# Patient Record
Sex: Female | Born: 1955
Health system: Southern US, Community
[De-identification: ages and names within clinical notes are randomized; demographics above are authoritative.]

## PROBLEM LIST (undated history)

## (undated) DIAGNOSIS — C801 Malignant (primary) neoplasm, unspecified: Secondary | ICD-10-CM

## (undated) DIAGNOSIS — G709 Myoneural disorder, unspecified: Secondary | ICD-10-CM

## (undated) DIAGNOSIS — E119 Type 2 diabetes mellitus without complications: Secondary | ICD-10-CM

## (undated) DIAGNOSIS — K219 Gastro-esophageal reflux disease without esophagitis: Secondary | ICD-10-CM

## (undated) HISTORY — PX: EYE SURGERY: SHX253

---

## 2016-09-27 ENCOUNTER — Other Ambulatory Visit: Payer: Self-pay | Admitting: Surgery

## 2016-09-27 DIAGNOSIS — R9389 Abnormal findings on diagnostic imaging of other specified body structures: Secondary | ICD-10-CM

## 2016-10-01 ENCOUNTER — Ambulatory Visit
Admission: RE | Admit: 2016-10-01 | Discharge: 2016-10-01 | Disposition: A | Discharge status: Home / Self Care | Payer: BLUE CROSS/BLUE SHIELD | Source: Ambulatory Visit | Attending: Surgery | Admitting: Surgery

## 2016-10-01 DIAGNOSIS — R9389 Abnormal findings on diagnostic imaging of other specified body structures: Secondary | ICD-10-CM

## 2016-10-01 MED ORDER — GADOBENATE DIMEGLUMINE 529 MG/ML IV SOLN
17.0000 mL | Freq: Once | INTRAVENOUS | Status: AC | PRN
  Administered 2016-10-01: 17 mL via INTRAVENOUS

## 2016-10-08 DIAGNOSIS — Z170 Estrogen receptor positive status [ER+]: Secondary | ICD-10-CM | POA: Diagnosis not present

## 2016-10-08 DIAGNOSIS — N62 Hypertrophy of breast: Secondary | ICD-10-CM | POA: Diagnosis not present

## 2016-10-08 DIAGNOSIS — D0512 Intraductal carcinoma in situ of left breast: Secondary | ICD-10-CM | POA: Diagnosis not present

## 2016-11-01 ENCOUNTER — Ambulatory Visit: Payer: Self-pay | Admitting: Surgery

## 2016-11-01 DIAGNOSIS — D0512 Intraductal carcinoma in situ of left breast: Secondary | ICD-10-CM

## 2016-11-01 NOTE — H&P (Signed)
History of Present Illness April Hogan. Kassie Keng MD; 11/01/2016 12:19 PM) Patient words: discuss surgery.  The patient is a 60 year old female who presents with breast cancer. The patient is a 60 year old female who presents with breast cancer. PCP - Kennith Maes, MD Surgeon - Melynda Ripple, DO Oncology - Lavera Guise, MD Neurology - [name] Orthopedics - [name]  This is a 60 year old female with a history of multiple sclerosis and diabetes type 2 who presents after recent abnormal mammogram. Her mammograms have been normal going back to 2011. However on 08/13/16 a screening mammogram showed possible asymmetry and calcifications in the left breast. The right breast was unremarkable. On 08/27/16, she underwent diagnostic mammogram and ultrasound. This showed an 8 x 5 x 5 mm group of indeterminate microcalcifications in the lateral lower left breast. She still has some asymmetry in the upper posterior left breast. On 09/13/16 she underwent stereotactic needle biopsy of both of these areas. The area of calcifications was marked with a ribbon-shaped clip. The area of asymmetry in the upper breast was marked with a coil-shaped clip. Unfortunately, on the postbiopsy mammogram the coil-shaped clip had migrated approximately 1 cm above the focal asymmetry. The ribbon-shaped marker had migrated 4.5 cm superior to the calcifications.  The left biopsy site associated with the calcifications returned a diagnosis of intermediate grade DCIS with an incidental foci of atypical lobular hyperplasia. ER 100% PR 90%. The area of asymmetry that was biopsied shows focal atypical lobular hyperplasia. The patient subsequently underwent bilateral breast MRI on 09/22/69. This had a new finding of an 8 x 6 x 5 mm enhancing mass in the lower outer quadrant of the right breast that was not visualized on mammogram. The 2 biopsied areas in the left breast were visualized with no additional findings. All of  these procedures were performed at [facility]. The patient was referred to the surgeon at [facility] and apparently plans were made to perform a needle localized lumpectomy of the DCIS.  On 10/01/16, she underwent MR guided biopsy of the nodule in the right breast. A marker clip was also placed. Pathology showed atypical lobular hyperplasia. The patient has apparently also met with oncology at [facility]. Dr. Bobby Rumpf' recommendation was for lumpectomy x all three lesions, followed by radiation to the DCIS lumpectomy site, followed by Tamoxifen.   According to Dr. Carlos Levering notes, he is recommending needle localized lumpectomy 3 to include the area of DCIS, as well as both areas of atypical lobular hyperplasia.  The patient has also been suffering from a lot of chronic left knee pain. She was scheduled for left total knee replacement by Dr. Adin Hector prior to her diagnosis of breast cancer. Apparently he has told the patient that he will not perform the knee replacement for at least 12 weeks after her course of radiation. It is unclear why he needs to wait this long after radiation. The patient does not feel that she can wait this long. She comes back today to discuss her decision for her treatment plan.   Menarche - 37 First pregnancy - 89 Breastfeed - yes Hormones - about 10 years Menopause - age 81 Family history - none   Problem List/Past Medical April Petties, MD; 11/01/2016 12:19 PM) ATYPICAL LOBULAR HYPERPLASIA (ALH) OF BOTH BREASTS (N60.91, N60.92) DUCTAL CARCINOMA IN SITU (DCIS) OF LEFT BREAST (D05.12) MULTIPLE SCLEROSIS (G35)  Other Problems April Petties, MD; 11/01/2016 12:19 PM) Heart murmur Hemorrhoids Hypercholesterolemia Breast Cancer Diabetes Mellitus  Past Surgical History April Petties, MD; 11/01/2016 12:19 PM) Cataract Surgery Bilateral. Breast Biopsy Bilateral.  Diagnostic Studies History April Petties, MD; 11/01/2016 12:19  PM) Colonoscopy never Mammogram within last year Pap Smear 1-5 years ago  Allergies (April Eversole, LPN; 16/09/9603 5:40 AM) No Known Drug Allergies 10/13/2016  Medication History (April Eversole, LPN; 98/10/9146 8:29 AM) Gilenya (0.5MG Capsule, Oral) Active. Omeprazole (20MG Capsule DR, Oral) Active. MetFORMIN HCl ER (500MG Tablet ER 24HR, Oral) Active. Celecoxib (200MG Capsule, Oral) Active. Pravastatin Sodium (40MG Tablet, Oral) Active. Lumigan (0.01% Solution, Ophthalmic) Active. Simbrinza (1-0.2% Suspension, Ophthalmic) Active. Vitamin D3 (5000UNIT Tablet, Oral two times daily) Active. Garlic (5621HY Capsule, Oral) Active. Vitamin B12 (1000MCG Tablet ER, 2500 Oral) Active. Vitamin A (5000UNIT Tablet, Oral) Active. Turmeric Curcumin (500MG Capsule, Oral) Active. Medications Reconciled  Social History April Petties, MD; 11/01/2016 12:19 PM) Caffeine use Coffee. No drug use Tobacco use Former smoker. Alcohol use Moderate alcohol use.  Family History April Petties, MD; 11/01/2016 12:19 PM) Anesthetic complications Family Members In General. Arthritis Mother. Respiratory Condition Mother. Kidney Disease Father. Cancer Father, Mother, Sister. Diabetes Mellitus Father. Hypertension Father.  Pregnancy / Birth History April Petties, MD; 11/01/2016 12:19 PM) Age at menarche 20 years. Para 3 Age of menopause 40-60 Gravida 3 Maternal age 20-30    Vitals (April Eversole LPN; 86/57/8469 6:29 AM) 11/01/2016 9:05 AM Weight: 159.4 lb Height: 65in Body Surface Area: 1.8 m Body Mass Index: 26.53 kg/m  Temp.: 55F(Oral)  Pulse: 73 (Regular)  BP: 124/78 (Sitting, Left Arm, Standard)      Physical Exam April Key K. Roberth Berling MD; 11/01/2016 12:20 PM)  The physical exam findings are as follows: Note:WDWN in NAD Eyes: Pupils equal, round; sclera anicteric HENT: Oral mucosa moist; good dentition Neck: No masses palpated,  no thyromegaly Lungs: CTA bilaterally; normal respiratory effort Breasts: symmetric, no nipple retraction or discharge; no axillary lymphadenopathy Bilateral fibrocystic changes - no palpable dominant masses Healing biopsy sites in lateral left breast, lower lateral right breast CV: Regular rate and rhythm; no murmurs; extremities well-perfused with no edema Abd: +bowel sounds, soft, non-tender, no palpable organomegaly; no palpable hernias Skin: Warm, dry; no sign of jaundice Psychiatric - alert and oriented x 4; calm mood and affect    Assessment & Plan April Key K. Korene Dula MD; 11/01/2016 12:29 PM)  DUCTAL CARCINOMA IN SITU (DCIS) OF LEFT BREAST (D05.12)  ATYPICAL LOBULAR HYPERPLASIA (ALH) OF BOTH BREASTS (N60.91)  Current Plans Schedule for Surgery - Left radioactive seed localized lumpectomy. The surgical procedure has been discussed with the patient. Potential risks, benefits, alternative treatments, and expected outcomes have been explained. All of the patient's questions at this time have been answered. The likelihood of reaching the patient's treatment goal is good. The patient understand the proposed surgical procedure and wishes to proceed. Note:The patient is in a difficult situation because of her concurrent DCIS as well as her severe left knee problems. Here is the course of treatment that she would like to pursue.  #1 left breast lumpectomy of the area of DCIS using radioactive seed localization. She will forego excision of the 2 atypical lobular hyperplasia sites and we will repeat imaging and possible biopsy in 1 year. #2 will discuss postoperative hormonal therapy with Dr. Bobby Rumpf #3 if orthopedics is not willing to perform her knee replacement immediately after radiation, then she wants to delay radiation treatments until she can have her knee surgery and is fully recovered.  She understands that the standard recommendation would be  for lumpectomy of DCIS as well as the 2  areas of ALH. However, it is reasonable to delay excision of the areas of [facility]. I have discussed her situation by telephone with Dr. Bobby Rumpf and he concurs with our plan as stated above. Her radiation therapy may be delayed until after her knee surgery. He will determine the timing of her hormonal therapy until probably after her knee surgery to decrease the risk of DVT.  We will proceed in scheduling her lumpectomy  April Hogan. Georgette Dover, MD, [facility]  General/ Trauma Surgery  11/01/2016 12:29 PM

## 2016-11-16 ENCOUNTER — Other Ambulatory Visit: Payer: Self-pay | Admitting: Surgery

## 2016-11-16 DIAGNOSIS — D0512 Intraductal carcinoma in situ of left breast: Secondary | ICD-10-CM

## 2016-11-18 ENCOUNTER — Encounter (HOSPITAL_BASED_OUTPATIENT_CLINIC_OR_DEPARTMENT_OTHER): Payer: Self-pay | Admitting: *Deleted

## 2016-11-22 ENCOUNTER — Encounter (HOSPITAL_BASED_OUTPATIENT_CLINIC_OR_DEPARTMENT_OTHER)
Admission: RE | Admit: 2016-11-22 | Discharge: 2016-11-22 | Disposition: A | Discharge status: Home / Self Care | Payer: BLUE CROSS/BLUE SHIELD | Source: Ambulatory Visit | Attending: Orthopedic Surgery | Admitting: Orthopedic Surgery

## 2016-11-22 ENCOUNTER — Ambulatory Visit
Admission: RE | Admit: 2016-11-22 | Discharge: 2016-11-22 | Disposition: A | Discharge status: Home / Self Care | Payer: BLUE CROSS/BLUE SHIELD | Source: Ambulatory Visit | Attending: Surgery | Admitting: Surgery

## 2016-11-22 DIAGNOSIS — N6091 Unspecified benign mammary dysplasia of right breast: Secondary | ICD-10-CM | POA: Diagnosis not present

## 2016-11-22 DIAGNOSIS — Z87891 Personal history of nicotine dependence: Secondary | ICD-10-CM | POA: Diagnosis not present

## 2016-11-22 DIAGNOSIS — Z79899 Other long term (current) drug therapy: Secondary | ICD-10-CM | POA: Diagnosis not present

## 2016-11-22 DIAGNOSIS — Z170 Estrogen receptor positive status [ER+]: Secondary | ICD-10-CM | POA: Diagnosis not present

## 2016-11-22 DIAGNOSIS — G8929 Other chronic pain: Secondary | ICD-10-CM | POA: Diagnosis not present

## 2016-11-22 DIAGNOSIS — D0512 Intraductal carcinoma in situ of left breast: Secondary | ICD-10-CM

## 2016-11-22 DIAGNOSIS — G35 Multiple sclerosis: Secondary | ICD-10-CM | POA: Diagnosis not present

## 2016-11-22 DIAGNOSIS — N6092 Unspecified benign mammary dysplasia of left breast: Secondary | ICD-10-CM | POA: Diagnosis not present

## 2016-11-22 DIAGNOSIS — E7800 Pure hypercholesterolemia, unspecified: Secondary | ICD-10-CM | POA: Diagnosis not present

## 2016-11-22 DIAGNOSIS — Z7984 Long term (current) use of oral hypoglycemic drugs: Secondary | ICD-10-CM | POA: Diagnosis not present

## 2016-11-22 DIAGNOSIS — E119 Type 2 diabetes mellitus without complications: Secondary | ICD-10-CM | POA: Diagnosis not present

## 2016-11-22 DIAGNOSIS — K219 Gastro-esophageal reflux disease without esophagitis: Secondary | ICD-10-CM | POA: Diagnosis not present

## 2016-11-22 DIAGNOSIS — M25562 Pain in left knee: Secondary | ICD-10-CM | POA: Diagnosis not present

## 2016-11-22 LAB — BASIC METABOLIC PANEL
ANION GAP: 11 (ref 5–15)
BUN: 13 mg/dL (ref 6–20)
CO2: 26 mmol/L (ref 22–32)
Calcium: 9.8 mg/dL (ref 8.9–10.3)
Chloride: 104 mmol/L (ref 101–111)
Creatinine, Ser: 0.59 mg/dL (ref 0.44–1.00)
GFR calc Af Amer: >60 mL/min (ref >60)
Glucose, Bld: 115 mg/dL — ABNORMAL HIGH (ref 65–99)
POTASSIUM: 4.3 mmol/L (ref 3.5–5.1)
SODIUM: 141 mmol/L (ref 135–145)

## 2016-11-22 NOTE — Progress Notes (Signed)
Pt received 8 oz carton of boost breeze with written and verbal instructions to drink by 730 morning of surgery  Teach back  Pt voiced understanding

## 2016-11-23 ENCOUNTER — Encounter (HOSPITAL_BASED_OUTPATIENT_CLINIC_OR_DEPARTMENT_OTHER): Payer: Self-pay | Admitting: Anesthesiology

## 2016-11-23 ENCOUNTER — Encounter (HOSPITAL_BASED_OUTPATIENT_CLINIC_OR_DEPARTMENT_OTHER)
Admission: RE | Disposition: A | Discharge status: Home / Self Care | Payer: Self-pay | Source: Ambulatory Visit | Attending: Surgery

## 2016-11-23 ENCOUNTER — Ambulatory Visit (HOSPITAL_BASED_OUTPATIENT_CLINIC_OR_DEPARTMENT_OTHER): Payer: BLUE CROSS/BLUE SHIELD | Admitting: Anesthesiology

## 2016-11-23 ENCOUNTER — Ambulatory Visit (HOSPITAL_BASED_OUTPATIENT_CLINIC_OR_DEPARTMENT_OTHER)
Admission: RE | Admit: 2016-11-23 | Discharge: 2016-11-23 | Disposition: A | Discharge status: Home / Self Care | Payer: BLUE CROSS/BLUE SHIELD | Source: Ambulatory Visit | Attending: Surgery | Admitting: Surgery

## 2016-11-23 ENCOUNTER — Ambulatory Visit
Admission: RE | Admit: 2016-11-23 | Discharge: 2016-11-23 | Disposition: A | Discharge status: Home / Self Care | Payer: BLUE CROSS/BLUE SHIELD | Source: Ambulatory Visit | Attending: Surgery | Admitting: Surgery

## 2016-11-23 DIAGNOSIS — D0512 Intraductal carcinoma in situ of left breast: Secondary | ICD-10-CM | POA: Diagnosis not present

## 2016-11-23 DIAGNOSIS — Z7984 Long term (current) use of oral hypoglycemic drugs: Secondary | ICD-10-CM | POA: Insufficient documentation

## 2016-11-23 DIAGNOSIS — N6092 Unspecified benign mammary dysplasia of left breast: Secondary | ICD-10-CM | POA: Insufficient documentation

## 2016-11-23 DIAGNOSIS — K219 Gastro-esophageal reflux disease without esophagitis: Secondary | ICD-10-CM | POA: Insufficient documentation

## 2016-11-23 DIAGNOSIS — Z87891 Personal history of nicotine dependence: Secondary | ICD-10-CM | POA: Insufficient documentation

## 2016-11-23 DIAGNOSIS — Z170 Estrogen receptor positive status [ER+]: Secondary | ICD-10-CM | POA: Insufficient documentation

## 2016-11-23 DIAGNOSIS — G35 Multiple sclerosis: Secondary | ICD-10-CM | POA: Insufficient documentation

## 2016-11-23 DIAGNOSIS — M25562 Pain in left knee: Secondary | ICD-10-CM | POA: Insufficient documentation

## 2016-11-23 DIAGNOSIS — N6091 Unspecified benign mammary dysplasia of right breast: Secondary | ICD-10-CM | POA: Insufficient documentation

## 2016-11-23 DIAGNOSIS — Z79899 Other long term (current) drug therapy: Secondary | ICD-10-CM | POA: Insufficient documentation

## 2016-11-23 DIAGNOSIS — G8929 Other chronic pain: Secondary | ICD-10-CM | POA: Insufficient documentation

## 2016-11-23 DIAGNOSIS — E119 Type 2 diabetes mellitus without complications: Secondary | ICD-10-CM | POA: Insufficient documentation

## 2016-11-23 DIAGNOSIS — E7800 Pure hypercholesterolemia, unspecified: Secondary | ICD-10-CM | POA: Insufficient documentation

## 2016-11-23 HISTORY — DX: Myoneural disorder, unspecified: G70.9

## 2016-11-23 HISTORY — DX: Malignant (primary) neoplasm, unspecified: C80.1

## 2016-11-23 HISTORY — DX: Gastro-esophageal reflux disease without esophagitis: K21.9

## 2016-11-23 HISTORY — PX: BREAST LUMPECTOMY WITH RADIOACTIVE SEED LOCALIZATION: SHX6424

## 2016-11-23 HISTORY — DX: Type 2 diabetes mellitus without complications: E11.9

## 2016-11-23 LAB — GLUCOSE, CAPILLARY
Glucose-Capillary: 83 mg/dL (ref 65–99)
Glucose-Capillary: 104 mg/dL — ABNORMAL HIGH (ref 65–99)

## 2016-11-23 SURGERY — BREAST LUMPECTOMY WITH RADIOACTIVE SEED LOCALIZATION
Anesthesia: General | Site: Breast | Laterality: Left

## 2016-11-23 MED ORDER — CEFAZOLIN SODIUM-DEXTROSE 2-4 GM/100ML-% IV SOLN
2.0000 g | INTRAVENOUS | Status: AC
  Administered 2016-11-23: 2 g via INTRAVENOUS

## 2016-11-23 MED ORDER — DEXAMETHASONE SODIUM PHOSPHATE 10 MG/ML IJ SOLN
INTRAMUSCULAR | Status: AC
  Filled 2016-11-23: qty 1

## 2016-11-23 MED ORDER — MIDAZOLAM HCL 2 MG/2ML IJ SOLN: 1.0000 mg | INTRAMUSCULAR | Status: DC | PRN

## 2016-11-23 MED ORDER — PROMETHAZINE HCL 25 MG/ML IJ SOLN: 6.2500 mg | INTRAMUSCULAR | Status: DC | PRN

## 2016-11-23 MED ORDER — MIDAZOLAM HCL 2 MG/2ML IJ SOLN
INTRAMUSCULAR | Status: AC
  Filled 2016-11-23: qty 2

## 2016-11-23 MED ORDER — LACTATED RINGERS IV SOLN
INTRAVENOUS | Status: DC
  Administered 2016-11-23 (×2): via INTRAVENOUS

## 2016-11-23 MED ORDER — LACTATED RINGERS IV SOLN
500.0000 mL | INTRAVENOUS | Status: DC
  Administered 2016-11-23 (×2): via INTRAVENOUS

## 2016-11-23 MED ORDER — FENTANYL CITRATE (PF) 100 MCG/2ML IJ SOLN
INTRAMUSCULAR | Status: AC
  Filled 2016-11-23: qty 2

## 2016-11-23 MED ORDER — PROPOFOL 10 MG/ML IV BOLUS
INTRAVENOUS | Status: AC
Start: 2016-11-23 — End: 2016-11-23
  Filled 2016-11-23: qty 20

## 2016-11-23 MED ORDER — PROMETHAZINE HCL 25 MG/ML IJ SOLN
6.2500 mg | INTRAMUSCULAR | Status: DC | PRN
Start: 2016-11-23 — End: 2016-11-23

## 2016-11-23 MED ORDER — BUPIVACAINE-EPINEPHRINE 0.25% -1:200000 IJ SOLN
INTRAMUSCULAR | Status: DC | PRN
  Administered 2016-11-23: 10 mL

## 2016-11-23 MED ORDER — CHLORHEXIDINE GLUCONATE CLOTH 2 % EX PADS: 6.0000 | MEDICATED_PAD | Freq: Once | CUTANEOUS | Status: DC

## 2016-11-23 MED ORDER — FENTANYL CITRATE (PF) 100 MCG/2ML IJ SOLN: 50.0000 ug | INTRAMUSCULAR | Status: DC | PRN

## 2016-11-23 MED ORDER — MIDAZOLAM HCL 5 MG/5ML IJ SOLN
INTRAMUSCULAR | Status: DC | PRN
  Administered 2016-11-23: 2 mg via INTRAVENOUS

## 2016-11-23 MED ORDER — LIDOCAINE HCL (CARDIAC) 20 MG/ML IV SOLN
INTRAVENOUS | Status: DC | PRN
  Administered 2016-11-23: 30 mg via INTRAVENOUS

## 2016-11-23 MED ORDER — PROPOFOL 10 MG/ML IV BOLUS
INTRAVENOUS | Status: DC | PRN
  Administered 2016-11-23: 150 mg via INTRAVENOUS

## 2016-11-23 MED ORDER — CELECOXIB 400 MG PO CAPS
400.0000 mg | ORAL_CAPSULE | ORAL | Status: AC
  Administered 2016-11-23: 400 mg via ORAL

## 2016-11-23 MED ORDER — ONDANSETRON HCL 4 MG/2ML IJ SOLN
INTRAMUSCULAR | Status: DC | PRN
  Administered 2016-11-23: 4 mg via INTRAVENOUS

## 2016-11-23 MED ORDER — EPHEDRINE SULFATE 50 MG/ML IJ SOLN
INTRAMUSCULAR | Status: DC | PRN
  Administered 2016-11-23: 25 mg via INTRAVENOUS

## 2016-11-23 MED ORDER — FENTANYL CITRATE (PF) 100 MCG/2ML IJ SOLN
25.0000 ug | INTRAMUSCULAR | Status: DC | PRN
  Administered 2016-11-23: 50 ug via INTRAVENOUS

## 2016-11-23 MED ORDER — ACETAMINOPHEN 500 MG PO TABS
ORAL_TABLET | ORAL | Status: AC
  Filled 2016-11-23: qty 2

## 2016-11-23 MED ORDER — LIDOCAINE 2% (20 MG/ML) 5 ML SYRINGE
INTRAMUSCULAR | Status: AC
  Filled 2016-11-23: qty 5

## 2016-11-23 MED ORDER — CELECOXIB 200 MG PO CAPS
ORAL_CAPSULE | ORAL | Status: AC
  Filled 2016-11-23: qty 2

## 2016-11-23 MED ORDER — HYDROCODONE-ACETAMINOPHEN 5-325 MG PO TABS
ORAL_TABLET | ORAL | Status: AC
  Filled 2016-11-23: qty 1

## 2016-11-23 MED ORDER — FENTANYL CITRATE (PF) 100 MCG/2ML IJ SOLN: 25.0000 ug | INTRAMUSCULAR | Status: DC | PRN

## 2016-11-23 MED ORDER — CEFAZOLIN SODIUM-DEXTROSE 2-4 GM/100ML-% IV SOLN
INTRAVENOUS | Status: AC
  Filled 2016-11-23: qty 100

## 2016-11-23 MED ORDER — ONDANSETRON HCL 4 MG/2ML IJ SOLN
INTRAMUSCULAR | Status: AC
  Filled 2016-11-23: qty 2

## 2016-11-23 MED ORDER — GABAPENTIN 300 MG PO CAPS
ORAL_CAPSULE | ORAL | Status: AC
  Filled 2016-11-23: qty 1

## 2016-11-23 MED ORDER — GABAPENTIN 300 MG PO CAPS
300.0000 mg | ORAL_CAPSULE | ORAL | Status: AC
  Administered 2016-11-23: 300 mg via ORAL

## 2016-11-23 MED ORDER — DEXAMETHASONE SODIUM PHOSPHATE 4 MG/ML IJ SOLN
INTRAMUSCULAR | Status: DC | PRN
  Administered 2016-11-23: 10 mg via INTRAVENOUS

## 2016-11-23 MED ORDER — ACETAMINOPHEN 500 MG PO TABS
1000.0000 mg | ORAL_TABLET | ORAL | Status: AC
  Administered 2016-11-23: 1000 mg via ORAL

## 2016-11-23 MED ORDER — SCOPOLAMINE 1 MG/3DAYS TD PT72: 1.0000 | MEDICATED_PATCH | Freq: Once | TRANSDERMAL | Status: DC | PRN

## 2016-11-23 MED ORDER — FENTANYL CITRATE (PF) 100 MCG/2ML IJ SOLN
INTRAMUSCULAR | Status: DC | PRN
  Administered 2016-11-23: 50 ug via INTRAVENOUS
  Administered 2016-11-23: 100 ug via INTRAVENOUS

## 2016-11-23 MED ORDER — HYDROCODONE-ACETAMINOPHEN 5-325 MG PO TABS
1.0000 | ORAL_TABLET | Freq: Once | ORAL | Status: AC | PRN
  Administered 2016-11-23: 1 via ORAL

## 2016-11-23 MED ORDER — HYDROCODONE-ACETAMINOPHEN 5-325 MG PO TABS: 1.0000 | ORAL_TABLET | Freq: Four times a day (QID) | ORAL | 0 refills | Status: AC | PRN

## 2016-11-23 MED ORDER — EPHEDRINE 5 MG/ML INJ
INTRAVENOUS | Status: AC
  Filled 2016-11-23: qty 10

## 2016-11-23 SURGICAL SUPPLY — 53 items
APPLIER CLIP 9.375 MED OPEN (MISCELLANEOUS) ×3
BENZOIN TINCTURE PRP APPL 2/3 (GAUZE/BANDAGES/DRESSINGS) ×3 IMPLANT
BLADE HEX COATED 2.75 (ELECTRODE) ×3 IMPLANT
BLADE SURG 15 STRL LF DISP TIS (BLADE) ×1 IMPLANT
BLADE SURG 15 STRL SS (BLADE) ×2
CANISTER SUC SOCK COL 7IN (MISCELLANEOUS) IMPLANT
CANISTER SUCT 1200ML W/VALVE (MISCELLANEOUS) ×3 IMPLANT
CHLORAPREP W/TINT 26ML (MISCELLANEOUS) ×3 IMPLANT
CLIP APPLIE 9.375 MED OPEN (MISCELLANEOUS) ×1 IMPLANT
CLOSURE WOUND 1/2 X4 (GAUZE/BANDAGES/DRESSINGS) ×1
COVER BACK TABLE 60X90IN (DRAPES) ×3 IMPLANT
COVER MAYO STAND STRL (DRAPES) ×3 IMPLANT
COVER PROBE W GEL 5X96 (DRAPES) ×3 IMPLANT
DECANTER SPIKE VIAL GLASS SM (MISCELLANEOUS) ×3 IMPLANT
DEVICE DUBIN W/COMP PLATE 8390 (MISCELLANEOUS) ×3 IMPLANT
DRAPE LAPAROTOMY 100X72 PEDS (DRAPES) ×3 IMPLANT
DRAPE UTILITY XL STRL (DRAPES) ×3 IMPLANT
DRSG TEGADERM 4X4.75 (GAUZE/BANDAGES/DRESSINGS) ×3 IMPLANT
ELECT BLADE 6.5 .24CM SHAFT (ELECTRODE) ×3 IMPLANT
ELECT REM PT RETURN 9FT ADLT (ELECTROSURGICAL) ×3
ELECTRODE REM PT RTRN 9FT ADLT (ELECTROSURGICAL) ×1 IMPLANT
GLOVE BIO SURGEON STRL SZ7 (GLOVE) ×3 IMPLANT
GLOVE BIOGEL PI IND STRL 7.0 (GLOVE) ×3 IMPLANT
GLOVE BIOGEL PI IND STRL 7.5 (GLOVE) ×1 IMPLANT
GLOVE BIOGEL PI INDICATOR 7.0 (GLOVE) ×6
GLOVE BIOGEL PI INDICATOR 7.5 (GLOVE) ×2
GOWN STRL REUS W/ TWL LRG LVL3 (GOWN DISPOSABLE) ×2 IMPLANT
GOWN STRL REUS W/TWL LRG LVL3 (GOWN DISPOSABLE) ×4
ILLUMINATOR WAVEGUIDE N/F (MISCELLANEOUS) ×3 IMPLANT
KIT MARKER MARGIN INK (KITS) ×3 IMPLANT
LIGHT WAVEGUIDE WIDE FLAT (MISCELLANEOUS) IMPLANT
NEEDLE HYPO 25X1 1.5 SAFETY (NEEDLE) ×3 IMPLANT
NS IRRIG 1000ML POUR BTL (IV SOLUTION) ×3 IMPLANT
PACK BASIN DAY SURGERY FS (CUSTOM PROCEDURE TRAY) ×3 IMPLANT
PENCIL BUTTON HOLSTER BLD 10FT (ELECTRODE) ×3 IMPLANT
SLEEVE SCD COMPRESS KNEE MED (MISCELLANEOUS) ×3 IMPLANT
SPONGE GAUZE 2X2 8PLY STER LF (GAUZE/BANDAGES/DRESSINGS)
SPONGE GAUZE 2X2 8PLY STRL LF (GAUZE/BANDAGES/DRESSINGS) IMPLANT
SPONGE GAUZE 4X4 12PLY STER LF (GAUZE/BANDAGES/DRESSINGS) ×3 IMPLANT
SPONGE LAP 18X18 X RAY DECT (DISPOSABLE) ×3 IMPLANT
SPONGE LAP 4X18 X RAY DECT (DISPOSABLE) ×3 IMPLANT
STRIP CLOSURE SKIN 1/2X4 (GAUZE/BANDAGES/DRESSINGS) ×2 IMPLANT
SUT MON AB 4-0 PC3 18 (SUTURE) ×3 IMPLANT
SUT SILK 2 0 SH (SUTURE) IMPLANT
SUT VIC AB 3-0 SH 27 (SUTURE) ×2
SUT VIC AB 3-0 SH 27X BRD (SUTURE) ×1 IMPLANT
SYR BULB 3OZ (MISCELLANEOUS) IMPLANT
SYR CONTROL 10ML LL (SYRINGE) ×3 IMPLANT
TOWEL OR 17X24 6PK STRL BLUE (TOWEL DISPOSABLE) ×3 IMPLANT
TOWEL OR NON WOVEN STRL DISP B (DISPOSABLE) ×3 IMPLANT
TUBE CONNECTING 20'X1/4 (TUBING) ×1
TUBE CONNECTING 20X1/4 (TUBING) ×2 IMPLANT
YANKAUER SUCT BULB TIP NO VENT (SUCTIONS) ×3 IMPLANT

## 2016-11-23 NOTE — Op Note (Signed)
Preop diagnosis: DCIS left breast Postop diagnosis: Same Procedure performed: Left radioactive seed localized lumpectomy Surgeon:Ordean Fouts K. Anesthesia: Gen. Indications: This is a 60 year old female with multiple sclerosis and type 2 diabetes who presents with recent diagnosis of DCIS in the left lateral lower breast. She had a by 5 x 5 mm group of microcalcifications. Biopsy showed DCIS intermediate grade. She had a biopsy clip placed at the time of stereotactic biopsy that this seemed to migrate from the area of calcification. She had a radioactive seed place yesterday. Interestingly, when they went to place the seed yesterday, the biopsy clip had migrated down from its original location. The presence of the seed was confirmed using neoprobe in the preoperative area.  Description of procedure: The patient is brought to the operating room placed in supine position on the operating room table. After an adequate level of general anesthesia was obtained, her right breast was prepped with ChloraPrep and draped sterile fashion. A timeout was taken to ensure the proper patient proper procedure. We interrogated the area in the right lower outer quadrant. The location the seed seems to be closer to the inframammary crease than it does to the edge of the areole up. Therefore we decided to do an inframammary incision. I made a 4 cm inframammary incision below the area of activity. We tunneled superiorly taking the breast tissue off of the pectoralis muscle. We continued until we were above the area of activity. We raised 4 margins around this area. We continued our dissection until we were several centimeters superior to the area of greatest activity. However with our manipulation of the specimen the radioactive seed was dislodged. We could visualize and we picked this up in place this in a separate specimen cup. There is no background activity as the seed is completely intact. I then created wide margins and  dissected several centimeters superior to the area where the seed had been located. We excised the specimen and oriented with a paint kit. We discussed this with the radiologist. The biopsy clip is seen within the superior end of the specimen. Since became from inferior to superior direction and took a wide specimen below the biopsy clip we felt certain that we had excised the previous area of DCIS along with our specimen. There are no residual microcalcifications. The specimen was sent for pathologic examination. We irrigated thoroughly and inspected for hemostasis. The wound was closed with 3-0 Vicryl 4-0 Monocryl. Steri-Strips and clean dressings were applied. The patient was then extubated and brought to recovery room stable condition. All sponge, instrument, and needle counts are correct.  Imogene Burn. Georgette Dover, MD, [facility] Surgery  General/ Trauma Surgery  11/23/2016 12:57 PM

## 2016-11-23 NOTE — Discharge Instructions (Signed)
[facility] °Office Phone Number [phone] ° °BREAST BIOPSY/ PARTIAL MASTECTOMY: POST OP INSTRUCTIONS ° °Always review your discharge instruction sheet given to you by the facility where your surgery was performed. ° °IF YOU HAVE DISABILITY OR FAMILY LEAVE FORMS, YOU MUST BRING THEM TO THE OFFICE FOR PROCESSING.  DO NOT GIVE THEM TO YOUR DOCTOR. ° °1. A prescription for pain medication may be given to you upon discharge.  Take your pain medication as prescribed, if needed.  If narcotic pain medicine is not needed, then you may take acetaminophen (Tylenol) or ibuprofen (Advil) as needed. °2. Take your usually prescribed medications unless otherwise directed °3. If you need a refill on your pain medication, please contact your pharmacy.  They will contact our office to request authorization.  Prescriptions will not be filled after 5pm or on week-ends. °4. You should eat very light the first 24 hours after surgery, such as soup, crackers, pudding, etc.  Resume your normal diet the day after surgery. °5. Most patients will experience some swelling and bruising in the breast.  Ice packs and a good support bra will help.  Swelling and bruising can take several days to resolve.  °6. It is common to experience some constipation if taking pain medication after surgery.  Increasing fluid intake and taking a stool softener will usually help or prevent this problem from occurring.  A mild laxative (Milk of Magnesia or Miralax) should be taken according to package directions if there are no bowel movements after 48 hours. °7. Unless discharge instructions indicate otherwise, you may remove your bandages 24-48 hours after surgery, and you may shower at that time.  You may have steri-strips (small skin tapes) in place directly over the incision.  These strips should be left on the skin for 7-10 days.  If your surgeon used skin glue on the incision, you may shower in 24 hours.  The glue will flake off over the  next 2-3 weeks.  Any sutures or staples will be removed at the office during your follow-up visit. °8. ACTIVITIES:  You may resume regular daily activities (gradually increasing) beginning the next day.  Wearing a good support bra or sports bra minimizes pain and swelling.  You may have sexual intercourse when it is comfortable. °a. You may drive when you no longer are taking prescription pain medication, you can comfortably wear a seatbelt, and you can safely maneuver your car and apply brakes. °b. RETURN TO WORK:  ______________________________________________________________________________________ °9. You should see your doctor in the office for a follow-up appointment approximately two weeks after your surgery.  Your doctor’s nurse will typically make your follow-up appointment when she calls you with your pathology report.  Expect your pathology report 2-3 business days after your surgery.  You may call to check if you do not hear from us after three days. °10. OTHER INSTRUCTIONS: _______________________________________________________________________________________________ _____________________________________________________________________________________________________________________________________ °_____________________________________________________________________________________________________________________________________ °_____________________________________________________________________________________________________________________________________ ° °WHEN TO CALL YOUR DOCTOR: °1. Fever over 101.0 °2. Nausea and/or vomiting. °3. Extreme swelling or bruising. °4. Continued bleeding from incision. °5. Increased pain, redness, or drainage from the incision. ° °The clinic staff is available to answer your questions during regular business hours.  Please don’t hesitate to call and ask to speak to one of the nurses for clinical concerns.  If you have a medical emergency, go to the nearest  emergency room or call 911.  A surgeon from [facility] is always on call at the hospital. ° °For further questions, please visit [email or web address]  ° ° ° °  Post Anesthesia Home Care Instructions ° °Activity: °Get plenty of rest for the remainder of the day. A responsible adult should stay with you for 24 hours following the procedure.  °For the next 24 hours, DO NOT: °-Drive a car °-Operate machinery °-Drink alcoholic beverages °-Take any medication unless instructed by your physician °-Make any legal decisions or sign important papers. ° °Meals: °Start with liquid foods such as gelatin or soup. Progress to regular foods as tolerated. Avoid greasy, spicy, heavy foods. If nausea and/or vomiting occur, drink only clear liquids until the nausea and/or vomiting subsides. Call your physician if vomiting continues. ° °Special Instructions/Symptoms: °Your throat may feel dry or sore from the anesthesia or the breathing tube placed in your throat during surgery. If this causes discomfort, gargle with warm salt water. The discomfort should disappear within 24 hours. ° °If you had a scopolamine patch placed behind your ear for the management of post- operative nausea and/or vomiting: ° °1. The medication in the patch is effective for 72 hours, after which it should be removed.  Wrap patch in a tissue and discard in the trash. Wash hands thoroughly with soap and water. °2. You may remove the patch earlier than 72 hours if you experience unpleasant side effects which may include dry mouth, dizziness or visual disturbances. °3. Avoid touching the patch. Wash your hands with soap and water after contact with the patch. °  ° °

## 2016-11-23 NOTE — Transfer of Care (Signed)
Immediate Anesthesia Transfer of Care Note  Patient: April Hogan  Procedure(s) Performed: Procedure(s): LEFT BREAST LUMPECTOMY WITH RADIOACTIVE SEED LOCALIZATION (Left)  Patient Location: PACU  Anesthesia Type:General  Level of Consciousness: awake and patient cooperative  Airway & Oxygen Therapy: Patient Spontanous Breathing and Patient connected to face mask oxygen  Post-op Assessment: Report given to RN and Post -op Vital signs reviewed and stable  Post vital signs: Reviewed and stable  Last Vitals:  Vitals:   11/23/16 0957  BP: (!) 148/78  Pulse: 87  Resp: 20  Temp: 36.6 C    Last Pain:  Vitals:   11/23/16 0957  TempSrc: Oral  PainSc: 3          Complications: No apparent anesthesia complications

## 2016-11-23 NOTE — Anesthesia Postprocedure Evaluation (Signed)
Anesthesia Post Note  Patient: Production manager  Procedure(s) Performed: Procedure(s) (LRB): LEFT BREAST LUMPECTOMY WITH RADIOACTIVE SEED LOCALIZATION (Left)  Patient location during evaluation: PACU Anesthesia Type: General Level of consciousness: awake and alert Pain management: pain level controlled Vital Signs Assessment: post-procedure vital signs reviewed and stable Respiratory status: spontaneous breathing, nonlabored ventilation, respiratory function stable and patient connected to nasal cannula oxygen Cardiovascular status: blood pressure returned to baseline and stable Postop Assessment: no signs of nausea or vomiting Anesthetic complications: no       Last Vitals:  Vitals:   11/23/16 1330 11/23/16 1345  BP: 121/69 115/64  Pulse: 86 80  Resp: 18 12  Temp:      Last Pain:  Vitals:   11/23/16 1345  TempSrc:   PainSc: 3                  Catalina Gravel

## 2016-11-23 NOTE — Anesthesia Preprocedure Evaluation (Addendum)
Anesthesia Evaluation  Patient identified by MRN, date of birth, ID band Patient awake    Reviewed: Allergy & Precautions, NPO status , Patient's Chart, lab work & pertinent test results  Airway Mallampati: II  TM Distance: >3 FB Neck ROM: Full    Dental  (+) Teeth Intact, Dental Advisory Given   Pulmonary former smoker,    Pulmonary exam normal breath sounds clear to auscultation       Cardiovascular Exercise Tolerance: Good negative cardio ROS Normal cardiovascular exam Rhythm:Regular Rate:Normal     Neuro/Psych  Neuromuscular disease (Multiple sclerosis) negative psych ROS   GI/Hepatic Neg liver ROS, GERD  Medicated and Controlled,  Endo/Other  diabetes, Type 2, Oral Hypoglycemic Agents  Renal/GU negative Renal ROS     Musculoskeletal negative musculoskeletal ROS (+)   Abdominal   Peds  Hematology negative hematology ROS (+)   Anesthesia Other Findings Day of surgery medications reviewed with the patient.  Left breast cancer  Reproductive/Obstetrics                             Anesthesia Physical Anesthesia Plan  ASA: II  Anesthesia Plan: General   Post-op Pain Management:    Induction: Intravenous  Airway Management Planned: LMA  Additional Equipment:   Intra-op Plan:   Post-operative Plan: Extubation in OR  Informed Consent: I have reviewed the patients History and Physical, chart, labs and discussed the procedure including the risks, benefits and alternatives for the proposed anesthesia with the patient or authorized representative who has indicated his/her understanding and acceptance.   Dental advisory given  Plan Discussed with: CRNA  Anesthesia Plan Comments: (Risks/benefits of general anesthesia discussed with patient including risk of damage to teeth, lips, gum, and tongue, nausea/vomiting, allergic reactions to medications, and the possibility of heart attack,  stroke and death.  All patient questions answered.  Patient wishes to proceed.)        Anesthesia Quick Evaluation

## 2016-11-23 NOTE — Interval H&P Note (Signed)
History and Physical Interval Note:  11/23/2016 10:36 AM  April Hogan  has presented today for surgery, with the diagnosis of DCIS LEFT BREAST  The various methods of treatment have been discussed with the patient and family. After consideration of risks, benefits and other options for treatment, the patient has consented to  Procedure(s): LEFT BREAST LUMPECTOMY WITH RADIOACTIVE SEED LOCALIZATION (N/A) as a surgical intervention .  The patient's history has been reviewed, patient examined, no change in status, stable for surgery.  I have reviewed the patient's chart and labs.  Questions were answered to the patient's satisfaction.     Sumayah Bearse K.

## 2016-11-23 NOTE — Anesthesia Procedure Notes (Signed)
Procedure Name: LMA Insertion Date/Time: 11/23/2016 11:38 AM Performed by: Toula Moos L Pre-anesthesia Checklist: Patient identified, Emergency Drugs available, Suction available, Patient being monitored and Timeout performed Patient Re-evaluated:Patient Re-evaluated prior to inductionOxygen Delivery Method: Circle system utilized Preoxygenation: Pre-oxygenation with 100% oxygen Intubation Type: IV induction Ventilation: Mask ventilation without difficulty LMA: LMA inserted LMA Size: 4.0 Number of attempts: 1 Airway Equipment and Method: Bite block Placement Confirmation: positive ETCO2 Tube secured with: Tape Dental Injury: Teeth and Oropharynx as per pre-operative assessment

## 2016-11-23 NOTE — H&P (View-Only) (Signed)
History of Present Illness April Hogan. Trachelle Low MD; 11/01/2016 12:19 PM) Patient words: discuss surgery.  The patient is a 60 year old female who presents with breast cancer. The patient is a 60 year old female who presents with breast cancer. PCP - Kennith Maes, MD Surgeon - Melynda Ripple, DO Oncology - Lavera Guise, MD Neurology - [name] Orthopedics - [name]  This is a 60 year old female with a history of multiple sclerosis and diabetes type 2 who presents after recent abnormal mammogram. Her mammograms have been normal going back to 2011. However on 08/13/16 a screening mammogram showed possible asymmetry and calcifications in the left breast. The right breast was unremarkable. On 08/27/16, she underwent diagnostic mammogram and ultrasound. This showed an 8 x 5 x 5 mm group of indeterminate microcalcifications in the lateral lower left breast. She still has some asymmetry in the upper posterior left breast. On 09/13/16 she underwent stereotactic needle biopsy of both of these areas. The area of calcifications was marked with a ribbon-shaped clip. The area of asymmetry in the upper breast was marked with a coil-shaped clip. Unfortunately, on the postbiopsy mammogram the coil-shaped clip had migrated approximately 1 cm above the focal asymmetry. The ribbon-shaped marker had migrated 4.5 cm superior to the calcifications.  The left biopsy site associated with the calcifications returned a diagnosis of intermediate grade DCIS with an incidental foci of atypical lobular hyperplasia. ER 100% PR 90%. The area of asymmetry that was biopsied shows focal atypical lobular hyperplasia. The patient subsequently underwent bilateral breast MRI on 09/22/69. This had a new finding of an 8 x 6 x 5 mm enhancing mass in the lower outer quadrant of the right breast that was not visualized on mammogram. The 2 biopsied areas in the left breast were visualized with no additional findings. All of  these procedures were performed at [facility]. The patient was referred to the surgeon at [facility] and apparently plans were made to perform a needle localized lumpectomy of the DCIS.  On 10/01/16, she underwent MR guided biopsy of the nodule in the right breast. A marker clip was also placed. Pathology showed atypical lobular hyperplasia. The patient has apparently also met with oncology at [facility]. Dr. Bobby Rumpf' recommendation was for lumpectomy x all three lesions, followed by radiation to the DCIS lumpectomy site, followed by Tamoxifen.   According to Dr. Carlos Levering notes, he is recommending needle localized lumpectomy 3 to include the area of DCIS, as well as both areas of atypical lobular hyperplasia.  The patient has also been suffering from a lot of chronic left knee pain. She was scheduled for left total knee replacement by Dr. Adin Hector prior to her diagnosis of breast cancer. Apparently he has told the patient that he will not perform the knee replacement for at least 12 weeks after her course of radiation. It is unclear why he needs to wait this long after radiation. The patient does not feel that she can wait this long. She comes back today to discuss her decision for her treatment plan.   Menarche - 37 First pregnancy - 89 Breastfeed - yes Hormones - about 10 years Menopause - age 81 Family history - none   Problem List/Past Medical April Petties, MD; 11/01/2016 12:19 PM) ATYPICAL LOBULAR HYPERPLASIA (ALH) OF BOTH BREASTS (N60.91, N60.92) DUCTAL CARCINOMA IN SITU (DCIS) OF LEFT BREAST (D05.12) MULTIPLE SCLEROSIS (G35)  Other Problems April Petties, MD; 11/01/2016 12:19 PM) Heart murmur Hemorrhoids Hypercholesterolemia Breast Cancer Diabetes Mellitus  Past Surgical History April Petties, MD; 11/01/2016 12:19 PM) Cataract Surgery Bilateral. Breast Biopsy Bilateral.  Diagnostic Studies History April Petties, MD; 11/01/2016 12:19  PM) Colonoscopy never Mammogram within last year Pap Smear 1-5 years ago  Allergies (Ammie Eversole, LPN; 16/09/9603 5:40 AM) No Known Drug Allergies 10/13/2016  Medication History (Ammie Eversole, LPN; 98/10/9146 8:29 AM) Gilenya (0.5MG Capsule, Oral) Active. Omeprazole (20MG Capsule DR, Oral) Active. MetFORMIN HCl ER (500MG Tablet ER 24HR, Oral) Active. Celecoxib (200MG Capsule, Oral) Active. Pravastatin Sodium (40MG Tablet, Oral) Active. Lumigan (0.01% Solution, Ophthalmic) Active. Simbrinza (1-0.2% Suspension, Ophthalmic) Active. Vitamin D3 (5000UNIT Tablet, Oral two times daily) Active. Garlic (5621HY Capsule, Oral) Active. Vitamin B12 (1000MCG Tablet ER, 2500 Oral) Active. Vitamin A (5000UNIT Tablet, Oral) Active. Turmeric Curcumin (500MG Capsule, Oral) Active. Medications Reconciled  Social History April Petties, MD; 11/01/2016 12:19 PM) Caffeine use Coffee. No drug use Tobacco use Former smoker. Alcohol use Moderate alcohol use.  Family History April Petties, MD; 11/01/2016 12:19 PM) Anesthetic complications Family Members In General. Arthritis Mother. Respiratory Condition Mother. Kidney Disease Father. Cancer Father, Mother, Sister. Diabetes Mellitus Father. Hypertension Father.  Pregnancy / Birth History April Petties, MD; 11/01/2016 12:19 PM) Age at menarche 20 years. Para 3 Age of menopause 40-60 Gravida 3 Maternal age 20-30    Vitals ([name]; 86/57/8469 6:29 AM) 11/01/2016 9:05 AM Weight: 159.4 lb Height: 65in Body Surface Area: 1.8 m Body Mass Index: 26.53 kg/m  Temp.: 55F(Oral)  Pulse: 73 (Regular)  BP: 124/78 (Sitting, Left Arm, Standard)      Physical Exam [name]; 11/01/2016 12:20 PM)  The physical exam findings are as follows: Note:WDWN in NAD Eyes: Pupils equal, round; sclera anicteric HENT: Oral mucosa moist; good dentition Neck: No masses palpated,  no thyromegaly Lungs: CTA bilaterally; normal respiratory effort Breasts: symmetric, no nipple retraction or discharge; no axillary lymphadenopathy Bilateral fibrocystic changes - no palpable dominant masses Healing biopsy sites in lateral left breast, lower lateral right breast CV: Regular rate and rhythm; no murmurs; extremities well-perfused with no edema Abd: +bowel sounds, soft, non-tender, no palpable organomegaly; no palpable hernias Skin: Warm, dry; no sign of jaundice Psychiatric - alert and oriented x 4; calm mood and affect    Assessment & Plan Rodman Key K. Geremiah Fussell MD; 11/01/2016 12:29 PM)  DUCTAL CARCINOMA IN SITU (DCIS) OF LEFT BREAST (D05.12)  ATYPICAL LOBULAR HYPERPLASIA (ALH) OF BOTH BREASTS (N60.91)  Current Plans Schedule for Surgery - Left radioactive seed localized lumpectomy. The surgical procedure has been discussed with the patient. Potential risks, benefits, alternative treatments, and expected outcomes have been explained. All of the patient's questions at this time have been answered. The likelihood of reaching the patient's treatment goal is good. The patient understand the proposed surgical procedure and wishes to proceed. Note:The patient is in a difficult situation because of her concurrent DCIS as well as her severe left knee problems. Here is the course of treatment that she would like to pursue.  #1 left breast lumpectomy of the area of DCIS using radioactive seed localization. She will forego excision of the 2 atypical lobular hyperplasia sites and we will repeat imaging and possible biopsy in 1 year. #2 will discuss postoperative hormonal therapy with Dr. Bobby Rumpf #3 if orthopedics is not willing to perform her knee replacement immediately after radiation, then she wants to delay radiation treatments until she can have her knee surgery and is fully recovered.  She understands that the standard recommendation would be  for lumpectomy of DCIS as well as the 2  areas of ALH. However, it is reasonable to delay excision of the areas of [facility]. I have discussed her situation by telephone with Dr. Bobby Rumpf and he concurs with our plan as stated above. Her radiation therapy may be delayed until after her knee surgery. He will determine the timing of her hormonal therapy until probably after her knee surgery to decrease the risk of DVT.  We will proceed in scheduling her lumpectomy  April Hogan. Georgette Dover, MD, [facility] Surgery  General/ Trauma Surgery  11/01/2016 12:29 PM

## 2016-11-24 ENCOUNTER — Encounter (HOSPITAL_BASED_OUTPATIENT_CLINIC_OR_DEPARTMENT_OTHER): Payer: Self-pay | Admitting: Surgery

## 2017-01-21 DIAGNOSIS — Z170 Estrogen receptor positive status [ER+]: Secondary | ICD-10-CM | POA: Diagnosis not present

## 2017-01-21 DIAGNOSIS — D0512 Intraductal carcinoma in situ of left breast: Secondary | ICD-10-CM | POA: Diagnosis not present

## 2017-08-02 DIAGNOSIS — Z79810 Long term (current) use of selective estrogen receptor modulators (SERMs): Secondary | ICD-10-CM | POA: Diagnosis not present

## 2017-08-02 DIAGNOSIS — Z170 Estrogen receptor positive status [ER+]: Secondary | ICD-10-CM | POA: Diagnosis not present

## 2017-08-02 DIAGNOSIS — D0512 Intraductal carcinoma in situ of left breast: Secondary | ICD-10-CM | POA: Diagnosis not present

## 2018-02-02 DIAGNOSIS — D0512 Intraductal carcinoma in situ of left breast: Secondary | ICD-10-CM | POA: Diagnosis not present

## 2018-02-02 DIAGNOSIS — Z79810 Long term (current) use of selective estrogen receptor modulators (SERMs): Secondary | ICD-10-CM | POA: Diagnosis not present

## 2018-02-02 DIAGNOSIS — Z170 Estrogen receptor positive status [ER+]: Secondary | ICD-10-CM | POA: Diagnosis not present

## 2018-06-21 IMAGING — MG NEEDLE LOCALIZATION OF THE LEFT BREAST WITH MAMMO GUIDANCE
8 of 9 series · 8 of 9 positions shown · non-contrast
Comparison: Previous exam(s).

CLINICAL DATA: Localization for surgery

EXAM:
MAMMOGRAPHIC GUIDED RADIOACTIVE SEED LOCALIZATION OF THE LEFT BREAST

[L ML (1 of 5)]
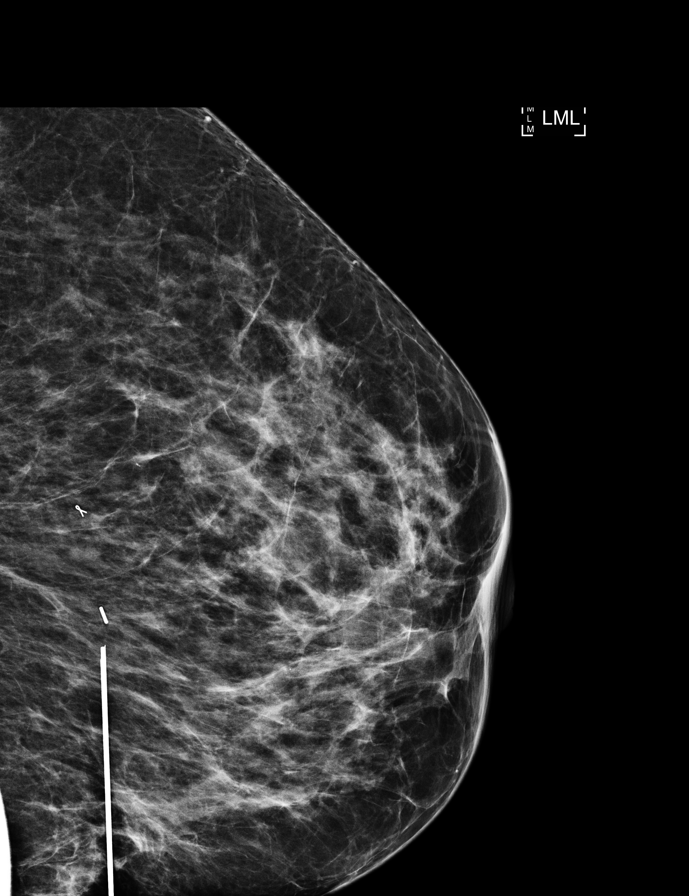

[L CC]
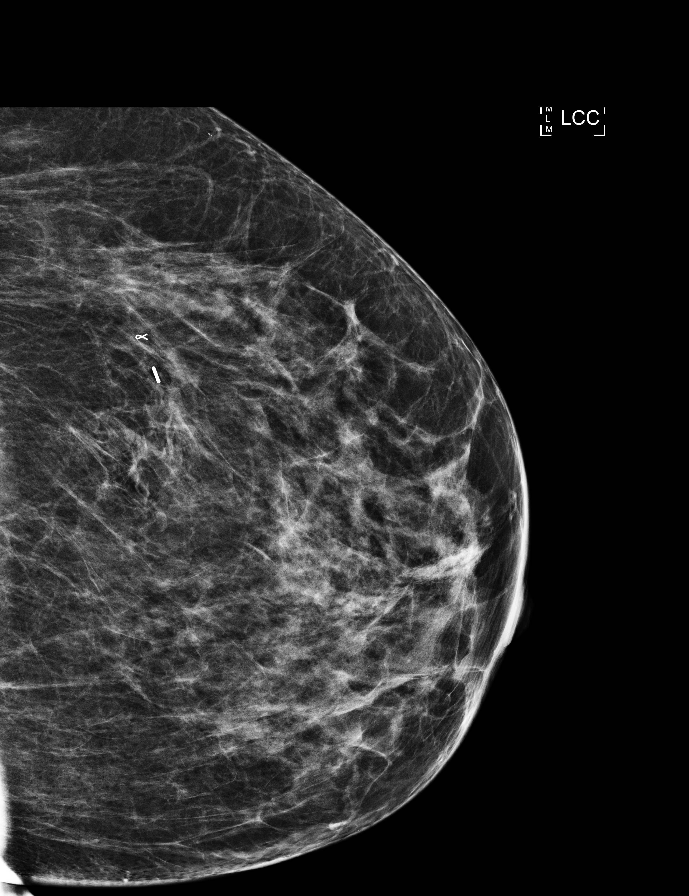

[L FB (1 of 2)]
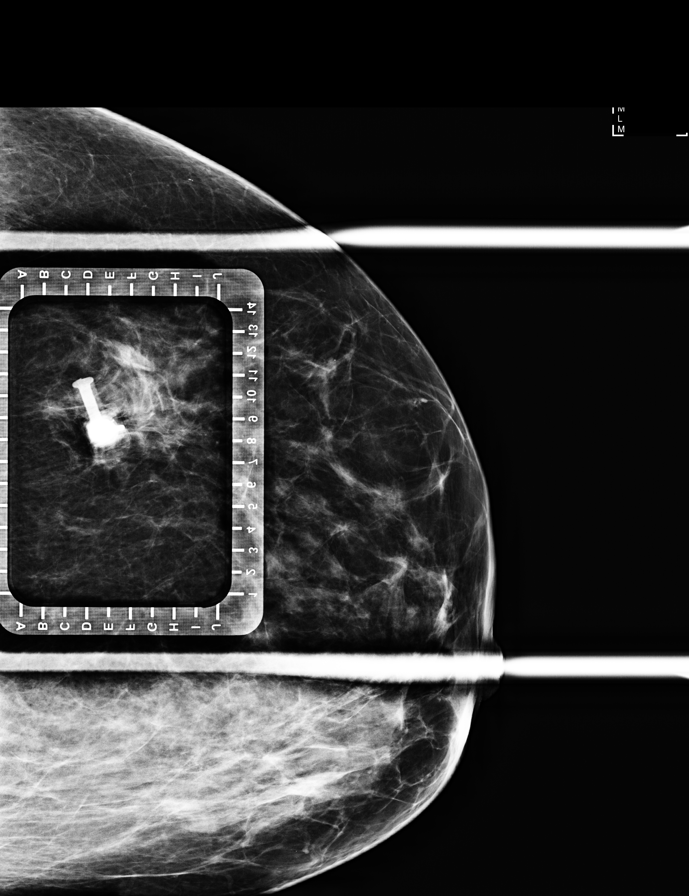

[L ML (2 of 5)]
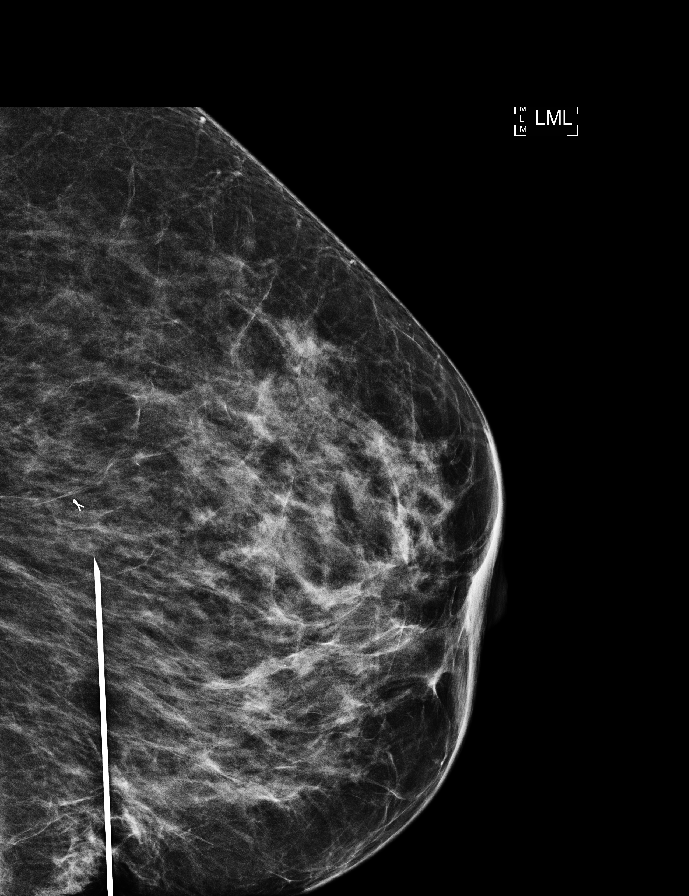

[L ML (3 of 5)]
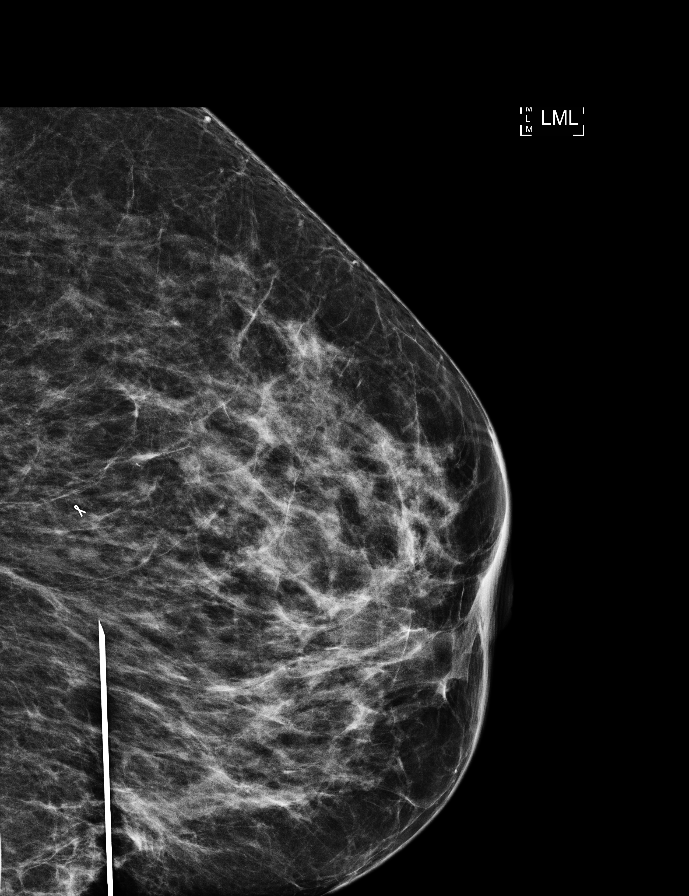

[L ML (4 of 5)]
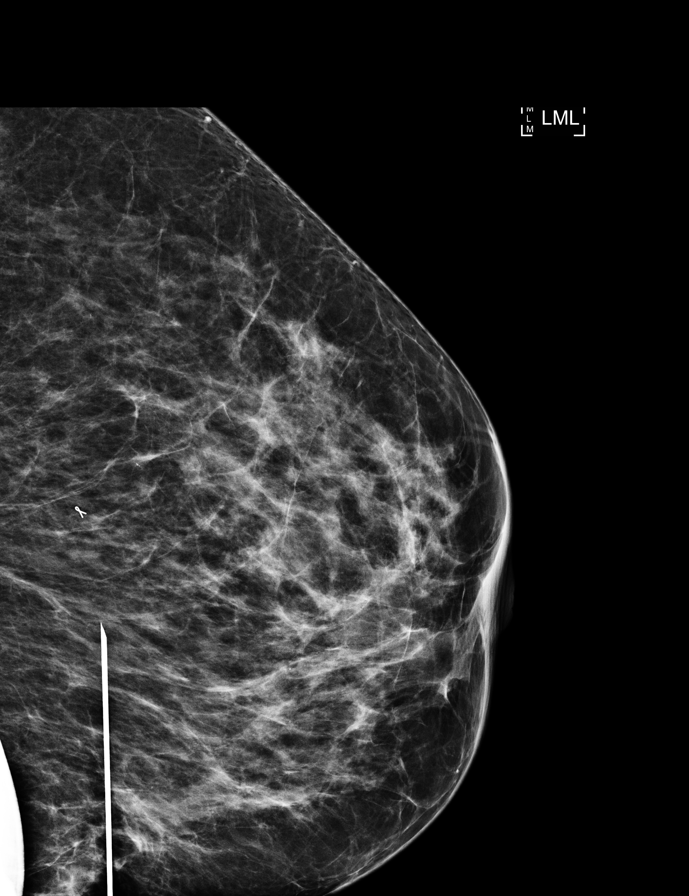

[L ML (5 of 5)]
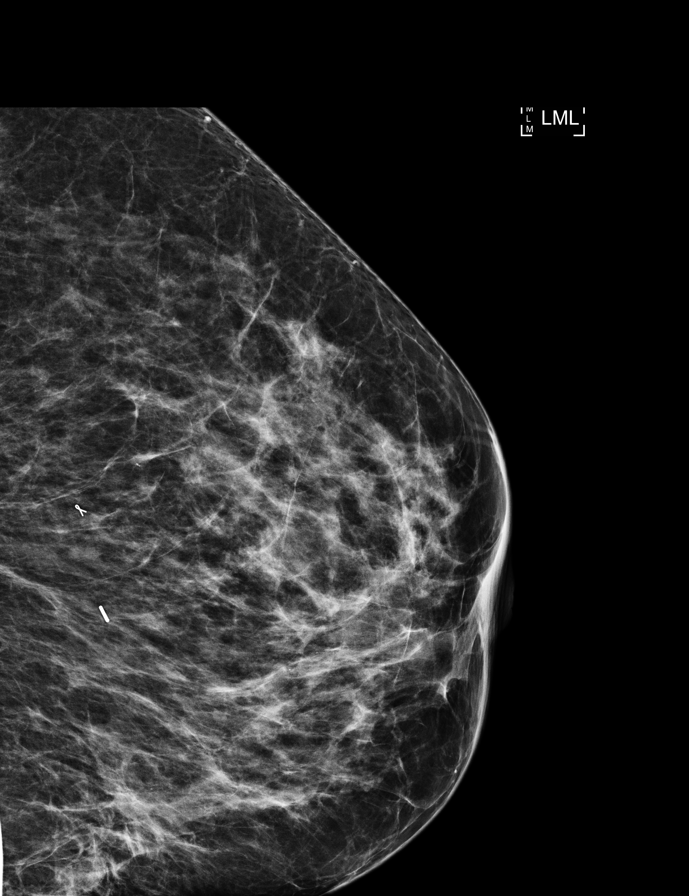

[L FB (2 of 2)]
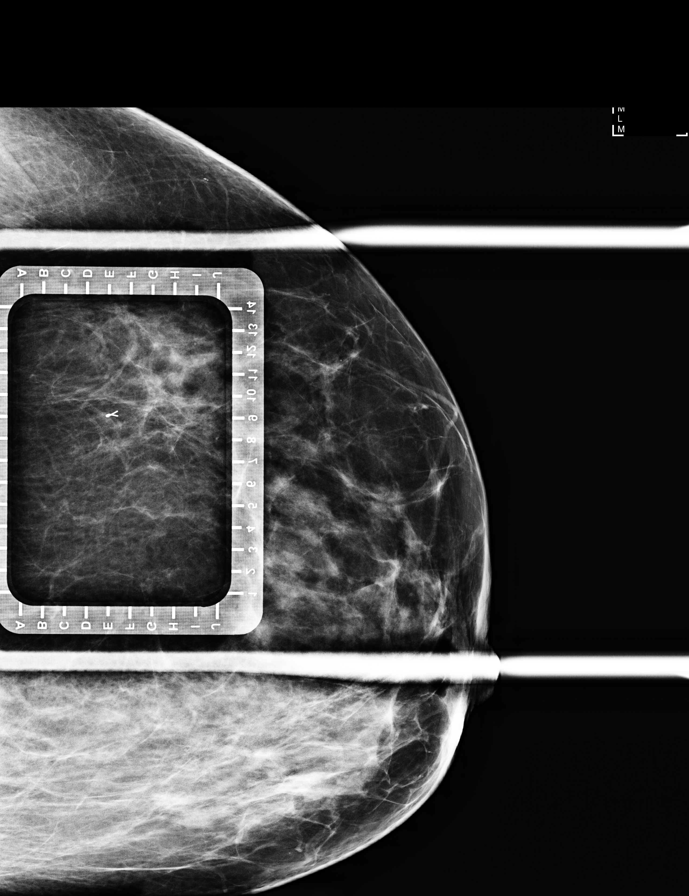

[8 of 9 positions shown; findings below may reference images not displayed]



The usual time-out protocol was performed immediately prior to the
procedure.

Using mammographic guidance, sterile technique, 1% lidocaine and an
[id number] radioactive seed, the region of the biopsy calcifications was
localized using a inferior approach. The follow-up mammogram images
confirm the seed in the expected location and were marked for
follow-up survey of the patient confirms presence of the radioactive
seed.

Order number of [id number] seed:  [id number].

Total activity:  0.250 millicuries  Reference Date: October 08, 2016

The patient tolerated the procedure well and was released from the
[REDACTED]. She was given instructions regarding seed removal.
IMPRESSION: Radioactive seed localization left breast. No apparent
complications.

Of note, at the time of stereotactic biopsy, the biopsy clip was
noted to be 4.5 cm above the biopsied calcifications. However, at
the time of today's procedure, the clip had migrated more inferiorly
and was less than 4.5 cm above the region of the biopsied
calcifications. The region of calcifications was localized today.
